# Patient Record
Sex: Female | Born: 1980 | Race: White | Hispanic: No | Marital: Married | Smoking: Current some day smoker
Health system: Southern US, Community
[De-identification: ages and names within clinical notes are randomized; demographics above are authoritative.]

---
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
## 2006-06-28 ENCOUNTER — Ambulatory Visit (HOSPITAL_COMMUNITY): Admission: RE | Admit: 2006-06-28 | Discharge: 2006-06-28 | Payer: Self-pay | Admitting: Obstetrics and Gynecology

## 2006-07-20 ENCOUNTER — Inpatient Hospital Stay (HOSPITAL_COMMUNITY): Admission: AD | Admit: 2006-07-20 | Discharge: 2006-07-22 | Payer: Self-pay | Admitting: Obstetrics and Gynecology

## 2011-02-09 ENCOUNTER — Ambulatory Visit: Payer: Self-pay | Admitting: Sports Medicine

## 2011-09-06 ENCOUNTER — Ambulatory Visit: Payer: Self-pay | Admitting: Sports Medicine

## 2014-11-25 ENCOUNTER — Encounter (HOSPITAL_COMMUNITY): Payer: Self-pay

## 2014-11-25 ENCOUNTER — Ambulatory Visit (HOSPITAL_COMMUNITY): Payer: 59 | Attending: Orthopedic Surgery

## 2014-11-25 DIAGNOSIS — R29898 Other symptoms and signs involving the musculoskeletal system: Secondary | ICD-10-CM | POA: Insufficient documentation

## 2014-11-25 DIAGNOSIS — M25511 Pain in right shoulder: Secondary | ICD-10-CM | POA: Insufficient documentation

## 2014-11-25 DIAGNOSIS — M6289 Other specified disorders of muscle: Secondary | ICD-10-CM

## 2014-11-25 DIAGNOSIS — M629 Disorder of muscle, unspecified: Secondary | ICD-10-CM

## 2014-11-25 DIAGNOSIS — M25611 Stiffness of right shoulder, not elsewhere classified: Secondary | ICD-10-CM

## 2014-11-25 DIAGNOSIS — Z9889 Other specified postprocedural states: Secondary | ICD-10-CM

## 2014-11-25 DIAGNOSIS — M7581 Other shoulder lesions, right shoulder: Secondary | ICD-10-CM | POA: Insufficient documentation

## 2014-11-25 NOTE — Therapy (Signed)
[facility] [place], [place], [address] Phone: [phone]   Fax:  [phone]  Occupational Therapy Evaluation  Patient Details  Name: Brenda Cherry MRN: [id number] Date of Birth: [birth year] Referring Provider:  Gildardo Cranker, MD  Encounter Date: 11/25/2014      OT End of Session - 11/25/14 1453    Visit Number 1   Number of Visits 20   Date for OT Re-Evaluation 01/24/15  mini reassessment:12/23/14   Authorization Type [employer] PPO (20 visit limit)   Authorization Time Period before 10th visit   Authorization - Visit Number 1   Authorization - Number of Visits 10   OT Start Time 0109   OT Stop Time 1356   OT Time Calculation (min) 49 min   Activity Tolerance Patient tolerated treatment well;Patient limited by pain   Behavior During Therapy [facility] for tasks assessed/performed      History reviewed. No pertinent past medical history.  No past surgical history on file.  There were no vitals filed for this visit.  Visit Diagnosis:  S/P arthroscopy of shoulder - Plan: Ot plan of care cert/re-cert  Pain in joint, shoulder region, right - Plan: Ot plan of care cert/re-cert  Tight fascia - Plan: Ot plan of care cert/re-cert  Weakness of shoulder - Plan: Ot plan of care cert/re-cert  Decreased range of motion of shoulder, right - Plan: Ot plan of care cert/re-cert      Subjective Assessment - 11/25/14 1427    Subjective  S: The pain has been excruiating.   Pertinent History Patient is a 34 y/o female s/p right shoulder arthroscopy on 11/10/14. Removal of calcium deposits in the supraspinatus tendon in the right shoulder performed. 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 Dr. Alphonzo Cruise has ferred patient to oocupational therapy for evaluation and treatment.    Patient Stated Goals To lift my right arm, drive,  brush hair, and play with my son.   Currently in Pain? Yes   Pain Score 8    Pain Location Shoulder   Pain Orientation Right   Pain Descriptors / Indicators Aching   Pain Type Acute pain;Surgical pain   Pain Onset 1 to 4 weeks ago   Pain Frequency Constant   Pain Relieving Factors Pain meds, ice and heat therapy.   Effect of Pain on Daily Activities Severe           [facility] OT Assessment - 11/25/14 1307    Assessment   Diagnosis right shoulder tendonitis   Onset Date --  ~approx. Feb. 2016   Prior Therapy None   Precautions   Precautions Shoulder   Type of Shoulder Precautions Progress at tolerated   Shoulder Interventions Shoulder sling/immobilizer   Precaution Comments Complete PROM until 12/08/14 then progress to [facility] and progress as tolerated   Restrictions   Weight Bearing Restrictions Yes   Balance Screen   Has the patient fallen in the past 6 months No   Has the patient had a decrease in activity level because of a fear of falling?  No   Is the patient reluctant to leave their home because of a fear of falling?  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    Mobility   Mobility Status Independent   Written Expression   Dominant Hand Right   Vision - History   Baseline Vision No visual deficits   Cognition   Overall Cognitive Status Within Functional Limits for tasks assessed   Observation/Other Assessments    Observations 2 inch healed incision on right shoulder acromion process.   ROM / Strength   AROM / PROM / Strength AROM;PROM;Strength   Palpation   Palpation comment Max fascial restrctions and tender points in right upper arm, trapezius, and scapularis region.    AROM   Overall AROM  Unable to assess;Due to pain   Overall AROM Comments --   AROM Assessment Site Shoulder   Right/Left Shoulder Right   PROM   Overall PROM Comments Assessed supine. IR/ER adducted   PROM Assessment Site Shoulder   Right/Left Shoulder Right   Right Shoulder Flexion 111 Degrees   Right Shoulder ABduction 85 Degrees   Right Shoulder Internal Rotation 90 Degrees   Right Shoulder External Rotation 30 Degrees   Strength   Overall Strength Unable to assess;Due to pain   Strength Assessment Site Shoulder   Right/Left Shoulder Right                         OT Education - 11/25/14 1453    Education Details table slides, pendulum exercises, wrist and elbow AROM   Person(s) Educated Patient   Methods Explanation;Demonstration;Handout   Comprehension Verbalized understanding;Returned demonstration          OT Short Term Goals - 11/25/14 1630    OT SHORT TERM GOAL #1   Title Patient will be educated and independent with HEP.   Time 4   Period Weeks   Status New   OT SHORT TERM GOAL #2   Title Patient will increase PROM to [facility] to increase ability to donn/doff shirts with less difficulty.    Time 4   Period Weeks   Status New   OT SHORT TERM GOAL #3   Title Patient will increase strength to 3/5 in RUE to increase ability to do light household tasks.    Time 4   Period Weeks   Status New   OT SHORT TERM GOAL #4   Title Patient will report a pain level of 5/10 or less with daily tasks.    Time 4   Period Weeks   Status New   OT SHORT TERM GOAL #5   Title Patient will decrease fascial restrctions from max to mod amount.    Time 4   Period Weeks   Status New           OT  Long Term Goals - 11/25/14 1644    OT LONG TERM GOAL #1   Title Patient will return to highest level of independence with all daily and work related tasks.    Time 8   Period Weeks   Status New   OT LONG TERM GOAL #2   Title Patient will increase AROM to  WFL to increase ability to reach into overhead cabinets and to be able to wash hair.    Time 8   Period Weeks   Status New   OT LONG TERM GOAL #3   Title Patient will increase RUE strength to 4/5 to increase ability to complete farm tasks with less difficulty.    Time 8   Period Weeks   Status New   OT LONG TERM GOAL #4   Title Patient will decrease pain to 3/10 or less with daily tasks.    Time 8   Period Weeks   Status New   OT LONG TERM GOAL #5   Title Patient will decrease fascial restrctions from mod to min amount.    Time 8   Period Weeks   Status New               Plan - 12/14/14 1618    Clinical Impression Statement A: Patient is a 34 y/o female s/p right shoulder arthroscopy causing increased pain and fascial restrctions and decreased strength and ROM resulting in difficulty completing functional tasks using right arm as dominant extremity. Spoke with Nelma Rothman at Dr. Josefa Half office to clarify any protocol or precautions we should follow. Claiborne Billings stated that patient should continue wearing the sling and she may progress as tolerated .    Pt will benefit from skilled therapeutic intervention in order to improve on the following deficits (Retired) Pain;Decreased range of motion;Increased fascial restricitons;Impaired UE functional use;Decreased strength   Rehab Potential Excellent   OT Frequency 2x / week   OT Duration 8 weeks   OT Treatment/Interventions Therapeutic exercise;Self-care/ADL training;Patient/family education;Manual Therapy;Ultrasound;Therapeutic activities;Cryotherapy;Electrical Stimulation;Moist Heat;Passive range of motion;Scar mobilization   Plan P: Skilled OT services needed to increase  functional performance during ADL tasks using right UE as dominant extremity. Treatment Plan: myofascial release, muscle energy technique, PROM, AAROM, AROM, general strengthening, modalities PRN for pain management.    Consulted and Agree with Plan of Care Patient          G-Codes - 12/14/14 1646    Functional Assessment Tool Used Clinical judgement   Functional Limitation Carrying, moving and handling objects   Carrying, Moving and Handling Objects Current Status [phone]) 100 percent impaired, limited or restricted   Carrying, Moving and Handling Objects Goal Status ([id number]) At least 20 percent but less than 40 percent impaired, limited or restricted      Problem List There are no active problems to display for this patient.   [name], OTR/L,CBIS  [phone]  12-14-14, 4:52 PM  [facility] [address] [place], [place], [address] Phone: [phone]   Fax:  [phone]

## 2014-11-25 NOTE — Patient Instructions (Signed)
  TOWEL SLIDES COMPLETE FOR 1-3 MINUTES, 3-5 TIMES PER DAY  SHOULDER: Flexion On Table   Place hands on table, elbows straight. Move hips away from body. Press hands down into table.   Abduction (Passive)   With arm out to side, resting on table, lower head toward arm, keeping trunk away from table.   Copyright  VHI. All rights reserved.     Internal Rotation (Assistive)   Seated with elbow bent at right angle and held against side, slide arm on table surface in an inward arc.  Activity: Use this motion to brush crumbs off the table.  Copyright  VHI. All rights reserved.    COMPLETE PENDULUM EXERCISES FOR 30 SECONDS TO A MINUTE EACH, 3-5 TIMES PER DAY. ROM: Pendulum (Side-to-Side)   Side to side.  http://orth.exer.us/792   Copyright  VHI. All rights reserved.  Pendulum Forward/Back   Bend forward 90 at waist, using table for support. Rock body forward and back to swing arm. Repeat ____ times. Do ____ sessions per day.  Copyright  VHI. All rights reserved.  Pendulum Circular   Bend forward 90 at waist, leaning on table for support. Rock body in a circular pattern to move arm clockwise ____ times then counterclockwise ____ times. Do ____ sessions per day.  Copyright  VHI. All rights reserved.  AROM: Wrist Extension   With right palm down, bend wrist up. Repeat 10____ times per set. Do ____ sets per session. Do __3__ sessions per day.  Copyright  VHI. All rights reserved.   AROM: Wrist Flexion   With right palm up, bend wrist up. Repeat ___10_ times per set. Do ____ sets per session. Do __3__ sessions per day.  Copyright  VHI. All rights reserved.   AROM: Forearm Pronation / Supination   With right arm in handshake position, slowly rotate palm down until stretch is felt. Relax. Then rotate palm up until stretch is felt. Repeat __10__ times per set. Do ____ sets per session. Do __3__ sessions per day.  Copyright  VHI. All rights reserved.    AFlexion (Passive)   Use other hand to bend elbow, with thumb toward same shoulder. Do NOT force this motion. Marland Kitchen

## 2014-11-26 ENCOUNTER — Ambulatory Visit (HOSPITAL_COMMUNITY): Payer: 59 | Admitting: Occupational Therapy

## 2014-11-26 ENCOUNTER — Encounter (HOSPITAL_COMMUNITY): Payer: Self-pay | Admitting: Occupational Therapy

## 2014-11-26 ENCOUNTER — Encounter (HOSPITAL_COMMUNITY): Payer: Medicare HMO

## 2014-11-26 DIAGNOSIS — M25611 Stiffness of right shoulder, not elsewhere classified: Secondary | ICD-10-CM

## 2014-11-26 DIAGNOSIS — M25511 Pain in right shoulder: Secondary | ICD-10-CM

## 2014-11-26 DIAGNOSIS — M629 Disorder of muscle, unspecified: Secondary | ICD-10-CM

## 2014-11-26 DIAGNOSIS — M6289 Other specified disorders of muscle: Secondary | ICD-10-CM

## 2014-11-26 DIAGNOSIS — R29898 Other symptoms and signs involving the musculoskeletal system: Secondary | ICD-10-CM

## 2014-11-26 NOTE — Therapy (Signed)
[facility], [place], [address] Phone: [phone]   Fax:  [phone]  Occupational Therapy Treatment  Patient Details  Name: Brenda Cherry MRN: [id number] Date of Birth: 05/29/1981 Referring Provider:  Gildardo Cranker, MD  Encounter Date: 11/26/2014      OT End of Session - 11/26/14 1518    Visit Number 2   Number of Visits 20   Date for OT Re-Evaluation 01/24/15  mini reassessment:12/23/14   Authorization Type [employer] PPO (20 visit limit)   Authorization Time Period before 10th visit   Authorization - Visit Number 2   Authorization - Number of Visits 10   OT Start Time 1352   OT Stop Time 1440   OT Time Calculation (min) 48 min   Activity Tolerance Patient tolerated treatment well;Patient limited by pain   Behavior During Therapy [facility] for tasks assessed/performed      History reviewed. No pertinent past medical history.  No past surgical history on file.  There were no vitals filed for this visit.  Visit Diagnosis:  Pain in joint, shoulder region, right  Tight fascia  Weakness of shoulder  Decreased range of motion of shoulder, right      Subjective Assessment - 11/26/14 1357    Subjective  S: I'm not as stiff or as sore as I thought I would be.    Currently in Pain? Yes   Pain Score 5    Pain Location Shoulder   Pain Orientation Right   Pain Descriptors / Indicators Aching   Pain Type Acute pain            [facility] OT Assessment - 11/26/14 1516    Assessment   Diagnosis right shoulder tendonitis   Precautions   Precautions Shoulder   Type of Shoulder Precautions Progress at tolerated                  OT Treatments/Exercises (OP) - 11/26/14 1516    Exercises   Exercises Shoulder   Shoulder Exercises: Supine   Protraction PROM;10 reps   Horizontal ABduction PROM;10 reps   External Rotation PROM;10 reps   Internal Rotation PROM;10 reps   Flexion PROM;10 reps    ABduction PROM;10 reps   Shoulder Exercises: Seated   Elevation AROM;10 reps   Row AROM;10 reps   Manual Therapy   Manual Therapy Myofascial release   Myofascial Release Myofascial release to right bicep, upper arm, trapezius, and scapularis regions to decrease pain and fascial restrictions and to increase joint mobility                OT Education - 11/25/14 1453    Education Details table slides, pendulum exercises, wrist and elbow AROM   Person(s) Educated Patient   Methods Explanation;Demonstration;Handout   Comprehension Verbalized understanding;Returned demonstration          OT Short Term Goals - 11/26/14 1520    OT SHORT TERM GOAL #1   Title Patient will be educated and independent with HEP.   Time 4   Period Weeks   Status On-going   OT SHORT TERM GOAL #2   Title Patient will increase PROM to [facility] to increase ability to donn/doff shirts with less difficulty.    Time 4   Period Weeks   Status On-going   OT SHORT TERM GOAL #3   Title Patient will increase strength to 3/5 in RUE to increase ability to do light household tasks.  Time 4   Period Weeks   Status On-going   OT SHORT TERM GOAL #4   Title Patient will report a pain level of 5/10 or less with daily tasks.    Time 4   Period Weeks   Status On-going   OT SHORT TERM GOAL #5   Title Patient will decrease fascial restrctions from max to mod amount.    Time 4   Period Weeks   Status On-going           OT Long Term Goals - 11/26/14 1520    OT LONG TERM GOAL #1   Title Patient will return to highest level of independence with all daily and work related tasks.    Time 8   Period Weeks   Status On-going   OT LONG TERM GOAL #2   Title Patient will increase AROM to [facility] to increase ability to reach into overhead cabinets and to be able to wash hair.    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    Time 8   Period Weeks   Status On-going   OT LONG TERM GOAL #4   Title Patient will decrease pain to 3/10 or less with daily tasks.    Time 8   Period Weeks   Status On-going   OT LONG TERM GOAL #5   Title Patient will decrease fascial restrctions from mod to min amount.    Time 8   Period Weeks   Status On-going               Plan - 11/26/14 1518    Clinical Impression Statement A: Initiated myofascial release and manual therapy, PROM, AROM exercises this date. Pt reports decreased pain from evaluation yesterday. Pt reports she has been icing her arm and trying to complete her HEP. Pt tolerated treatment well. Provided pt with copy of evaluation and reviewed goals.    Plan P: Continue increasing PROM, increase AROM and therapy ball to 15 repetitions.           G-Codes - 11-Dec-2014 1646    Functional Assessment Tool Used Clinical judgement   Functional Limitation Carrying, moving and handling objects   Carrying, Moving and Handling Objects Current Status [phone]) 100 percent impaired, limited or restricted   Carrying, Moving and Handling Objects Goal Status ([id number]) At least 20 percent but less than 40 percent impaired, limited or restricted      Problem List There are no active problems to display for this patient.   Guadelupe Sabin, OTR/L  [phone]  11/26/2014, 3:22 PM  [facility] [address] [place], [place], [address] Phone: [phone]   Fax:  [phone]

## 2014-11-30 ENCOUNTER — Ambulatory Visit (HOSPITAL_COMMUNITY): Payer: 59

## 2014-11-30 DIAGNOSIS — M629 Disorder of muscle, unspecified: Secondary | ICD-10-CM

## 2014-11-30 DIAGNOSIS — M25611 Stiffness of right shoulder, not elsewhere classified: Secondary | ICD-10-CM

## 2014-11-30 DIAGNOSIS — M25511 Pain in right shoulder: Secondary | ICD-10-CM

## 2014-11-30 DIAGNOSIS — M6289 Other specified disorders of muscle: Secondary | ICD-10-CM

## 2014-11-30 DIAGNOSIS — R29898 Other symptoms and signs involving the musculoskeletal system: Secondary | ICD-10-CM

## 2014-11-30 NOTE — Therapy (Signed)
[facility], [place], [address] Phone: [phone]   Fax:  [phone]  Occupational Therapy Treatment  Patient Details  Name: Brenda Cherry MRN: [id number] Date of Birth: February 22, 1981 Referring Provider:  Gildardo Cranker, MD  Encounter Date: 11/30/2014      OT End of Session - 11/30/14 1348    Visit Number 3   Number of Visits 20   Date for OT Re-Evaluation 01/24/15  mini reassessment:12/23/14   Authorization Type [employer] PPO (20 visit limit)   Authorization Time Period before 10th visit   Authorization - Visit Number 3   Authorization - Number of Visits 10   OT Start Time 1309   OT Stop Time 1350   OT Time Calculation (min) 41 min   Activity Tolerance Patient tolerated treatment well;Patient limited by pain   Behavior During Therapy [facility] for tasks assessed/performed      No past medical history on file.  No past surgical history on file.  There were no vitals filed for this visit.  Visit Diagnosis:  Pain in joint, shoulder region, right  Tight fascia  Weakness of shoulder  Stiffness of joint, shoulder region, right                    OT Treatments/Exercises (OP) - 11/30/14 1337    Exercises   Exercises Shoulder   Shoulder Exercises: Supine   Protraction PROM;10 reps   Horizontal ABduction PROM;10 reps   External Rotation PROM;10 reps   Internal Rotation PROM;10 reps   Flexion PROM;10 reps   ABduction PROM;10 reps   Other Supine Exercises Bridges 12X   Shoulder Exercises: Therapy Ball   Flexion 15 reps   ABduction 15 reps   Shoulder Exercises: Isometric Strengthening   Flexion Supine;3X3"   Extension Supine;3X3"   External Rotation Supine;3X3"   Internal Rotation Supine;3X3"   ABduction Supine;3X3"   ADduction Supine;3X3"   Manual Therapy   Manual Therapy Myofascial release   Myofascial Release Myofascial release to right bicep, upper arm, trapezius, and  scapularis regions to decrease pain and fascial restrictions and to increase joint mobility                  OT Short Term Goals - 11/26/14 1520    OT SHORT TERM GOAL #1   Title Patient will be educated and independent with HEP.   Time 4   Period Weeks   Status On-going   OT SHORT TERM GOAL #2   Title Patient will increase PROM to [facility] to increase ability to donn/doff shirts with less difficulty.    Time 4   Period Weeks   Status On-going   OT SHORT TERM GOAL #3   Title Patient will increase strength to 3/5 in RUE to increase ability to do light household tasks.    Time 4   Period Weeks   Status On-going   OT SHORT TERM GOAL #4   Title Patient will report a pain level of 5/10 or less with daily tasks.    Time 4   Period Weeks   Status On-going   OT SHORT TERM GOAL #5   Title Patient will decrease fascial restrctions from max to mod amount.    Time 4   Period Weeks   Status On-going           OT Long Term Goals - 11/26/14 1520    OT LONG TERM GOAL #1   Title Patient  will return to highest level of independence with all daily and work related tasks.    Time 8   Period Weeks   Status On-going   OT LONG TERM GOAL #2   Title Patient will increase AROM to [facility] to increase ability to reach into overhead cabinets and to be able to wash hair.    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    Time 8   Period Weeks   Status On-going   OT LONG TERM GOAL #4   Title Patient will decrease pain to 3/10 or less with daily tasks.    Time 8   Period Weeks   Status On-going   OT LONG TERM GOAL #5   Title Patient will decrease fascial restrctions from mod to min amount.    Time 8   Period Weeks   Status On-going               Plan - 11/30/14 1348    Clinical Impression Statement A: Increased therapy ball exercises and added  isometric exercises and bridges supine. Patient tolerated well. Continues to have increased tenderness distal to incision at medial deltoid.    Plan P: Increase isometrics to 3x5. Cont to work on increasing PROM needed during functional tasks.         Problem List There are no active problems to display for this patient.   [name], OTR/L,CBIS  [phone]  11/30/2014, 2:34 PM  [facility] [address] [place], [place], [address] Phone: [phone]   Fax:  [phone]

## 2014-12-03 ENCOUNTER — Encounter (HOSPITAL_COMMUNITY): Payer: Self-pay

## 2014-12-03 ENCOUNTER — Ambulatory Visit (HOSPITAL_COMMUNITY): Payer: 59

## 2014-12-03 DIAGNOSIS — M6289 Other specified disorders of muscle: Secondary | ICD-10-CM

## 2014-12-03 DIAGNOSIS — M25611 Stiffness of right shoulder, not elsewhere classified: Secondary | ICD-10-CM

## 2014-12-03 DIAGNOSIS — R29898 Other symptoms and signs involving the musculoskeletal system: Secondary | ICD-10-CM

## 2014-12-03 DIAGNOSIS — M25511 Pain in right shoulder: Secondary | ICD-10-CM | POA: Diagnosis not present

## 2014-12-03 DIAGNOSIS — M629 Disorder of muscle, unspecified: Secondary | ICD-10-CM

## 2014-12-03 NOTE — Therapy (Signed)
[facility] [address] [place], [place], [address] Phone: [phone]   Fax:  [phone]  Occupational Therapy Treatment  Patient Details  Name: Brenda Cherry MRN: [id number] Date of Birth: 1981-04-19 Referring Provider:  Gildardo Cranker, MD  Encounter Date: 12/03/2014      OT End of Session - 12/03/14 1508    Visit Number 4   Number of Visits 20   Date for OT Re-Evaluation 01/24/15  mini reassessment:12/23/14   Authorization Type [employer] P[employer] (20 visit limit)   Authorization Time Period before 10th visit   Authorization - Visit Number 4   Authorization - Number of Visits 10   OT Start Time 4270   OT Stop Time 1430   OT Time Calculation (min) 35 min   Activity Tolerance Patient tolerated treatment well;Patient limited by pain   Behavior During Therapy [facility] for tasks assessed/performed      History reviewed. No pertinent past medical history.  No past surgical history on file.  There were no vitals filed for this visit.  Visit Diagnosis:  Tight fascia  Pain in joint, shoulder region, right  Weakness of shoulder  Stiffness of joint, shoulder region, right      Subjective Assessment - 12/03/14 1507    Subjective  S: I have been working really hard at home on this shoulder.    Currently in Pain? Yes   Pain Score 3    Pain Location Shoulder   Pain Orientation Right   Pain Descriptors / Indicators Aching   Pain Type Acute pain            [facility] OT Assessment - 12/03/14 1421    Assessment   Diagnosis right shoulder tendonitis   Precautions   Precautions Shoulder   Type of Shoulder Precautions Progress at tolerated                  OT Treatments/Exercises (OP) - 12/03/14 1421    Exercises   Exercises Shoulder   Shoulder Exercises: Supine   Protraction PROM;10 reps   Horizontal ABduction PROM;10 reps   External Rotation PROM;10 reps   Internal Rotation PROM;10 reps   Flexion PROM;10  reps   ABduction PROM;10 reps   Other Supine Exercises bridges 15X   Manual Therapy   Manual Therapy Myofascial release   Myofascial Release Myofascial release to right bicep, upper arm, trapezius, and scapularis regions to decrease pain and fascial restrictions and to increase joint mobility                  OT Short Term Goals - 11/26/14 1520    OT SHORT TERM GOAL #1   Title Patient will be educated and independent with HEP.   Time 4   Period Weeks   Status On-going   OT SHORT TERM GOAL #2   Title Patient will increase PROM to [facility] to increase ability to donn/doff shirts with less difficulty.    Time 4   Period Weeks   Status On-going   OT SHORT TERM GOAL #3   Title Patient will increase strength to 3/5 in RUE to increase ability to do light household tasks.    Time 4   Period Weeks   Status On-going   OT SHORT TERM GOAL #4   Title Patient will report a pain level of 5/10 or less with daily tasks.    Time 4   Period Weeks   Status On-going   OT SHORT TERM  GOAL #5   Title Patient will decrease fascial restrctions from max to mod amount.    Time 4   Period Weeks   Status On-going           OT Long Term Goals - 11/26/14 1520    OT LONG TERM GOAL #1   Title Patient will return to highest level of independence with all daily and work related tasks.    Time 8   Period Weeks   Status On-going   OT LONG TERM GOAL #2   Title Patient will increase AROM to [facility] to increase ability to reach into overhead cabinets and to be able to wash hair.    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    Time 8   Period Weeks   Status On-going   OT LONG TERM GOAL #4   Title Patient will decrease pain to 3/10 or less with daily tasks.    Time 8   Period Weeks   Status On-going   OT LONG TERM GOAL #5   Title Patient will decrease fascial  restrctions from mod to min amount.    Time 8   Period Weeks   Status On-going               Plan - 12/03/14 1509    Clinical Impression Statement A: Increased isometrics to 3x5. Patient was able to achieve full range of motion with shoulder flexion.   Plan P: Add anterior and caudel glides.         Problem List There are no active problems to display for this patient.   [name], OTR/L,CBIS  [phone]  12/03/2014, 3:44 PM  [place] [address] [place], [place], [address] Phone: [phone]   Fax:  [phone]

## 2014-12-07 ENCOUNTER — Ambulatory Visit (HOSPITAL_COMMUNITY): Payer: 59 | Admitting: Occupational Therapy

## 2014-12-10 ENCOUNTER — Encounter (HOSPITAL_COMMUNITY): Payer: Self-pay

## 2014-12-10 ENCOUNTER — Ambulatory Visit (HOSPITAL_COMMUNITY): Payer: 59

## 2014-12-10 DIAGNOSIS — M25611 Stiffness of right shoulder, not elsewhere classified: Secondary | ICD-10-CM

## 2014-12-10 DIAGNOSIS — R29898 Other symptoms and signs involving the musculoskeletal system: Secondary | ICD-10-CM

## 2014-12-10 DIAGNOSIS — M629 Disorder of muscle, unspecified: Secondary | ICD-10-CM

## 2014-12-10 DIAGNOSIS — M6289 Other specified disorders of muscle: Secondary | ICD-10-CM

## 2014-12-10 DIAGNOSIS — M25511 Pain in right shoulder: Secondary | ICD-10-CM | POA: Diagnosis not present

## 2014-12-10 NOTE — Therapy (Signed)
[facility] [address] [place], [place], [address] Phone: [phone]   Fax:  [phone]  Occupational Therapy Treatment  Patient Details  Name: Brenda Cherry MRN: [id number] Date of Birth: [birth year] Referring Provider:  Gildardo Cranker, MD  Encounter Date: 12/10/2014      OT End of Session - 12/10/14 1508    Visit Number 5   Number of Visits 20   Date for OT Re-Evaluation 01/24/15  mini reassessment:12/23/14   Authorization Type [employer] P[employer] (20 visit limit)   Authorization Time Period before 10th visit   Authorization - Visit Number 5   Authorization - Number of Visits 10   OT Start Time 9150   OT Stop Time 1348   OT Time Calculation (min) 35 min   Activity Tolerance Patient tolerated treatment well;Patient limited by pain   Behavior During Therapy [facility] for tasks assessed/performed      History reviewed. No pertinent past medical history.  No past surgical history on file.  There were no vitals filed for this visit.  Visit Diagnosis:  Tight fascia  Pain in joint, shoulder region, right  Weakness of shoulder  Stiffness of joint, shoulder region, right      Subjective Assessment - 12/10/14 1341    Subjective  S: I have had a really hard time getting comfortable at night to sleep.    Currently in Pain? Yes   Pain Score 3    Pain Location Shoulder   Pain Orientation Right   Pain Descriptors / Indicators Aching   Pain Type Acute pain            [facility] OT Assessment - 12/10/14 1344    Assessment   Diagnosis right shoulder tendonitis   Precautions   Precautions Shoulder   Type of Shoulder Precautions Progress at tolerated                  OT Treatments/Exercises (OP) - 12/10/14 1344    Exercises   Exercises Shoulder   Shoulder Exercises: Supine   Protraction PROM;10 reps   Horizontal ABduction PROM;10 reps   External Rotation PROM;10 reps   Internal Rotation PROM;10 reps   Flexion PROM;10 reps   ABduction PROM;10 reps   Other Supine Exercises bridges 15X   Shoulder Exercises: ROM/Strengthening   Anterior Glide 3X10"   Caudal Glide 3X10"   Manual Therapy   Manual Therapy Myofascial release   Myofascial Release Myofascial release to right bicep, upper arm, trapezius, and scapularis regions to decrease pain and fascial restrictions and to increase joint mobility                  OT Short Term Goals - 11/26/14 1520    OT SHORT TERM GOAL #1   Title Patient will be educated and independent with HEP.   Time 4   Period Weeks   Status On-going   OT SHORT TERM GOAL #2   Title Patient will increase PROM to [facility] to increase ability to donn/doff shirts with less difficulty.    Time 4   Period Weeks   Status On-going   OT SHORT TERM GOAL #3   Title Patient will increase strength to 3/5 in RUE to increase ability to do light household tasks.    Time 4   Period Weeks   Status On-going   OT SHORT TERM GOAL #4   Title Patient will report a pain level of 5/10 or less with daily tasks.  Time 4   Period Weeks   Status On-going   OT SHORT TERM GOAL #5   Title Patient will decrease fascial restrctions from max to mod amount.    Time 4   Period Weeks   Status On-going           OT Long Term Goals - 11/26/14 1520    OT LONG TERM GOAL #1   Title Patient will return to highest level of independence with all daily and work related tasks.    Time 8   Period Weeks   Status On-going   OT LONG TERM GOAL #2   Title Patient will increase AROM to [facility] to increase ability to reach into overhead cabinets and to be able to wash hair.    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    Time 8   Period Weeks   Status On-going   OT LONG TERM GOAL #4   Title Patient will decrease pain to 3/10 or less with daily tasks.    Time  8   Period Weeks   Status On-going   OT LONG TERM GOAL #5   Title Patient will decrease fascial restrctions from mod to min amount.    Time 8   Period Weeks   Status On-going               Plan - 12/10/14 1509    Clinical Impression Statement A: Pt reports that she is now able to wash her hair and wipe herself in the bathroom. Pt is making great progress towards therapy goals. patient recently was seen by surgeon although he did not give anything additional for pain or muscle spasms. Pt reports that she will be seeing her primary MD now for her shoulder. Added anterior and caudel glides. Pt tolerated well.    Plan P: Attempt AAROM.        Problem List There are no active problems to display for this patient.   [name], OTR/L,CBIS  [phone]  12/10/2014, 3:21 PM  [place] [address] [place], [place], [address] Phone: [phone]   Fax:  [phone]

## 2014-12-14 ENCOUNTER — Ambulatory Visit (HOSPITAL_COMMUNITY): Payer: 59

## 2014-12-14 ENCOUNTER — Encounter (HOSPITAL_COMMUNITY): Payer: Self-pay

## 2014-12-14 DIAGNOSIS — M6289 Other specified disorders of muscle: Secondary | ICD-10-CM

## 2014-12-14 DIAGNOSIS — M629 Disorder of muscle, unspecified: Secondary | ICD-10-CM

## 2014-12-14 DIAGNOSIS — M25511 Pain in right shoulder: Secondary | ICD-10-CM

## 2014-12-14 DIAGNOSIS — R29898 Other symptoms and signs involving the musculoskeletal system: Secondary | ICD-10-CM

## 2014-12-14 DIAGNOSIS — M25611 Stiffness of right shoulder, not elsewhere classified: Secondary | ICD-10-CM

## 2014-12-14 NOTE — Therapy (Signed)
[facility] [address] [place], [place], [address] Phone: [phone]   Fax:  [phone]  Occupational Therapy Treatment  Patient Details  Name: Brenda Cherry MRN: [id number] Date of Birth: 1980-11-29 Referring Provider:  Gildardo Cranker, MD  Encounter Date: 12/14/2014      OT End of Session - 12/14/14 1430    Visit Number 6   Number of Visits 20   Date for OT Re-Evaluation 01/24/15  mini reassessment:12/23/14   Authorization Type [employer] PPO (20 visit limit)   Authorization Time Period before 10th visit   Authorization - Visit Number 6   Authorization - Number of Visits 10   OT Start Time 1310   OT Stop Time 1350   OT Time Calculation (min) 40 min   Activity Tolerance Patient tolerated treatment well   Behavior During Therapy [facility] for tasks assessed/performed      History reviewed. No pertinent past medical history.  No past surgical history on file.  There were no vitals filed for this visit.  Visit Diagnosis:  Tight fascia  Pain in joint, shoulder region, right  Weakness of shoulder  Stiffness of joint, shoulder region, right      Subjective Assessment - 12/14/14 1327    Subjective  S: I have been really working on my exercises this weekend.    Currently in Pain? Yes   Pain Score 1    Pain Location Shoulder   Pain Orientation Right   Pain Descriptors / Indicators Dull;Aching   Pain Type Acute pain            [facility] OT Assessment - 12/14/14 1328    Assessment   Diagnosis right shoulder tendonitis   Precautions   Precautions Shoulder   Type of Shoulder Precautions Progress at tolerated                  OT Treatments/Exercises (OP) - 12/14/14 1328    Exercises   Exercises Shoulder   Shoulder Exercises: Supine   Protraction PROM;5 reps;AAROM;12 reps   Horizontal ABduction PROM;5 reps;AAROM;12 reps   External Rotation PROM;5 reps;AAROM;12 reps   Internal Rotation PROM;5  reps;AAROM;12 reps   Flexion PROM;5 reps;AAROM;12 reps   ABduction PROM;5 reps;AAROM;12 reps   Shoulder Exercises: Standing   Protraction AAROM;12 reps   Horizontal ABduction AAROM;10 reps   Flexion AAROM;10 reps   ABduction AAROM;10 reps   Manual Therapy   Manual Therapy Myofascial release   Myofascial Release Myofascial release to right bicep, upper arm, trapezius, and scapularis regions to decrease pain and fascial restrictions and to increase joint mobility                OT Education - 12/14/14 1334    Education provided Yes   Education Details AAROM exercises   Person(s) Educated Patient   Methods Explanation;Demonstration;Handout   Comprehension Verbalized understanding;Returned demonstration          OT Short Term Goals - 11/26/14 1520    OT SHORT TERM GOAL #1   Title Patient will be educated and independent with HEP.   Time 4   Period Weeks   Status On-going   OT SHORT TERM GOAL #2   Title Patient will increase PROM to [facility] to increase ability to donn/doff shirts with less difficulty.    Time 4   Period Weeks   Status On-going   OT SHORT TERM GOAL #3   Title Patient will increase strength to 3/5 in RUE to increase  ability to do light household tasks.    Time 4   Period Weeks   Status On-going   OT SHORT TERM GOAL #4   Title Patient will report a pain level of 5/10 or less with daily tasks.    Time 4   Period Weeks   Status On-going   OT SHORT TERM GOAL #5   Title Patient will decrease fascial restrctions from max to mod amount.    Time 4   Period Weeks   Status On-going           OT Long Term Goals - 11/26/14 1520    OT LONG TERM GOAL #1   Title Patient will return to highest level of independence with all daily and work related tasks.    Time 8   Period Weeks   Status On-going   OT LONG TERM GOAL #2   Title Patient will increase AROM to [facility] to increase ability to reach into overhead cabinets and to be able to wash hair.    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    Time 8   Period Weeks   Status On-going   OT LONG TERM GOAL #4   Title Patient will decrease pain to 3/10 or less with daily tasks.    Time 8   Period Weeks   Status On-going   OT LONG TERM GOAL #5   Title Patient will decrease fascial restrctions from mod to min amount.    Time 8   Period Weeks   Status On-going               Plan - 12/14/14 1431    Clinical Impression Statement A: Progressed to AAROM supine and standing, added proximal shoulder strengthening supine. Patient tolerated well. Some pain noted with standing AAROM. Pt given updated HEP.    Plan P: Complete missed standing AAROM exercises. Add wall wash and pulleys.        Problem List There are no active problems to display for this patient.   [name], OTR/L,CBIS  [phone]  12/14/2014, 2:33 PM  [facility] [address] [place], [place], [address] Phone: [phone]   Fax:  [phone]

## 2014-12-14 NOTE — Patient Instructions (Signed)
Perform each exercise ___12_____ reps. 2-3x days.   WAND PRESS - STANDING  Start by holding a wand or cane at chest height.  Next, slowly push the wand outwards in front of your body so that your elbows become fully straightened. Then, return to the original position.     WAND FLEXION - STANDING - PALMS UP  In the standing position, hold a wand/cane with both arms, palms up on both sides. Raise up the wand/cane allowing your unaffected arm to perform most of the effort. Your affected arm should be partially relaxed.      WAND ROTATION - STANDING  In the standing position, hold a wand/cane with both hands keeping your elbows bent. Move your arms and wand/cane to one side.  Your affected arm should be partially relaxed while your unaffected arm performs most of the effort.       WAND ABDUCTION - STANDING  While holding a wand/cane palm face up on the injured side and palm face down on the uninjured side, slowly raise up your injured arm to the side.   Horizontal Abduction/Adduction    Cane Horizontal - Standing   Straight arms holding cane at shoulder height, bring cane to right, center, left. Repeat starting to left.

## 2014-12-17 ENCOUNTER — Ambulatory Visit (HOSPITAL_COMMUNITY): Payer: 59

## 2014-12-17 DIAGNOSIS — M25611 Stiffness of right shoulder, not elsewhere classified: Secondary | ICD-10-CM

## 2014-12-17 DIAGNOSIS — M25511 Pain in right shoulder: Secondary | ICD-10-CM | POA: Diagnosis not present

## 2014-12-17 DIAGNOSIS — M629 Disorder of muscle, unspecified: Secondary | ICD-10-CM

## 2014-12-17 DIAGNOSIS — R29898 Other symptoms and signs involving the musculoskeletal system: Secondary | ICD-10-CM

## 2014-12-17 DIAGNOSIS — M6289 Other specified disorders of muscle: Secondary | ICD-10-CM

## 2014-12-17 NOTE — Therapy (Signed)
[facility] [address] [place], [place], [address] Phone: [phone]   Fax:  [phone]  Occupational Therapy Treatment  Patient Details  Name: Brenda Cherry MRN: [id number] Date of Birth: 1980/09/07 Referring Provider:  Gildardo Cranker, MD  Encounter Date: 12/17/2014      OT End of Session - 12/17/14 1456    Visit Number 7   Number of Visits 20   Date for OT Re-Evaluation 01/24/15  mini reassessment:12/23/14   Authorization Type [employer] PPO (20 visit limit)   Authorization Time Period before 10th visit   Authorization - Visit Number 7   Authorization - Number of Visits 10   OT Start Time 1305   OT Stop Time 1345   OT Time Calculation (min) 40 min   Activity Tolerance Patient tolerated treatment well   Behavior During Therapy [facility] for tasks assessed/performed      No past medical history on file.  No past surgical history on file.  There were no vitals filed for this visit.  Visit Diagnosis:  Tight fascia  Pain in joint, shoulder region, right  Weakness of shoulder  Stiffness of joint, shoulder region, right      Subjective Assessment - 12/17/14 1327    Subjective  S: My shoulder has just been angry since the last time.    Currently in Pain? Yes   Pain Score 6    Pain Location Shoulder   Pain Orientation Right   Pain Descriptors / Indicators Dull;Aching   Pain Type Acute pain            [facility] OT Assessment - 12/17/14 1327    Assessment   Diagnosis right shoulder tendonitis   Precautions   Precautions Shoulder   Type of Shoulder Precautions Progress at tolerated                  OT Treatments/Exercises (OP) - 12/17/14 1327    Exercises   Exercises Shoulder   Shoulder Exercises: Supine   Protraction PROM;5 reps;AAROM;10 reps   Horizontal ABduction PROM;5 reps;AAROM;10 reps   External Rotation PROM;5 reps;AAROM;10 reps   Internal Rotation PROM;5 reps;AAROM;10 reps   Flexion  PROM;5 reps;AAROM;10 reps   ABduction PROM;5 reps;AAROM;10 reps   Shoulder Exercises: Standing   Protraction AAROM;10 reps   Horizontal ABduction AAROM;10 reps   External Rotation AAROM;10 reps   Internal Rotation AAROM;10 reps   Flexion AAROM;10 reps   ABduction AAROM;10 reps   Shoulder Exercises: Pulleys   Flexion 1 minute   ABduction 1 minute   Shoulder Exercises: ROM/Strengthening   Wall Wash 1'   Proximal Shoulder Strengthening, Supine 10X no rest breaks   Manual Therapy   Manual Therapy Myofascial release   Myofascial Release Myofascial release to right bicep, upper arm, trapezius, and scapularis regions to decrease pain and fascial restrictions and to increase joint mobility                  OT Short Term Goals - 11/26/14 1520    OT SHORT TERM GOAL #1   Title Patient will be educated and independent with HEP.   Time 4   Period Weeks   Status On-going   OT SHORT TERM GOAL #2   Title Patient will increase PROM to [facility] to increase ability to donn/doff shirts with less difficulty.    Time 4   Period Weeks   Status On-going   OT SHORT TERM GOAL #3   Title Patient will increase  strength to 3/5 in RUE to increase ability to do light household tasks.    Time 4   Period Weeks   Status On-going   OT SHORT TERM GOAL #4   Title Patient will report a pain level of 5/10 or less with daily tasks.    Time 4   Period Weeks   Status On-going   OT SHORT TERM GOAL #5   Title Patient will decrease fascial restrctions from max to mod amount.    Time 4   Period Weeks   Status On-going           OT Long Term Goals - 11/26/14 1520    OT LONG TERM GOAL #1   Title Patient will return to highest level of independence with all daily and work related tasks.    Time 8   Period Weeks   Status On-going   OT LONG TERM GOAL #2   Title Patient will increase AROM to [facility] to increase ability to reach into overhead cabinets and to be able to wash hair.    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    Time 8   Period Weeks   Status On-going   OT LONG TERM GOAL #4   Title Patient will decrease pain to 3/10 or less with daily tasks.    Time 8   Period Weeks   Status On-going   OT LONG TERM GOAL #5   Title Patient will decrease fascial restrctions from mod to min amount.    Time 8   Period Weeks   Status On-going               Plan - 12/17/14 1456    Clinical Impression Statement A: Added pulleys and wall wash this date. patient tolerated well. Decreased reps due to patient complaining of increased pain from last session. Pt continues to have difficulty becoming comfortable at night.    Plan P: Increase reps back to 12X if able.        Problem List There are no active problems to display for this patient.   [name], OTR/L,CBIS  [phone]  12/17/2014, 2:58 PM  [place] [address] [place], [place], [address] Phone: [phone]   Fax:  [phone]

## 2014-12-21 ENCOUNTER — Ambulatory Visit (HOSPITAL_COMMUNITY): Payer: 59

## 2014-12-22 ENCOUNTER — Encounter (HOSPITAL_COMMUNITY): Payer: Medicare HMO

## 2014-12-24 ENCOUNTER — Encounter (HOSPITAL_COMMUNITY): Payer: Medicare HMO

## 2014-12-31 ENCOUNTER — Telehealth (HOSPITAL_COMMUNITY): Payer: Self-pay

## 2014-12-31 NOTE — Telephone Encounter (Signed)
7/7 left message to see if patient wanted to schedule more appointments.

## 2015-01-22 ENCOUNTER — Encounter (HOSPITAL_COMMUNITY): Payer: Self-pay

## 2015-01-22 NOTE — Therapy (Signed)
[facility] [address] [place], [place], [address] Phone: [phone]   Fax:  [phone]  Patient Details  Name: Brenda Cherry MRN: [id number] Date of Birth: 1980/08/24 Referring Provider:  No ref. provider found  Encounter Date: 01/22/2015 .OCCUPATIONAL THERAPY DISCHARGE SUMMARY  Visits from Start of Care: 7  Current functional level related to goals / functional outcomes: OT LONG TERM GOAL #1    Title Patient will return to highest level of independence with all daily and work related tasks.    Time 8   Period Weeks   Status On-going   OT LONG TERM GOAL #2   Title Patient will increase AROM to [facility] to increase ability to reach into overhead cabinets and to be able to wash hair.    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    Time 8   Period Weeks   Status On-going   OT LONG TERM GOAL #4   Title Patient will decrease pain to 3/10 or less with daily tasks.    Time 8   Period Weeks   Status On-going   OT LONG TERM GOAL #5   Title Patient will decrease fascial restrctions from mod to min amount.    Time 8   Period Weeks   Status On-going          Remaining deficits: Patient's last therapy appt completed was 12/17/14. Pt cancelled remaining appts due to scheduling conflict. Therapy clinic made an attempt to call and schedule more appts. Patient never returned call. Pt will be discharged to failure to return to clinic. If patient wishes to continue therapy she will need a new order signed by MD.    Education / Equipment: Table slides Plan:                                                    Patient goals were not met. Patient is being discharged due to not returning since the last visit.  ?????       [name],  OTR/L,CBIS  [phone]  01/22/2015, 10:45 AM  [place], [place], [address] Phone: [phone]   Fax:  [phone]

## 2015-10-13 ENCOUNTER — Other Ambulatory Visit (HOSPITAL_COMMUNITY): Payer: Self-pay | Admitting: Orthopedic Surgery

## 2015-10-13 DIAGNOSIS — M542 Cervicalgia: Secondary | ICD-10-CM

## 2015-10-13 DIAGNOSIS — M25511 Pain in right shoulder: Secondary | ICD-10-CM

## 2015-10-21 ENCOUNTER — Ambulatory Visit (HOSPITAL_COMMUNITY)
Admission: RE | Admit: 2015-10-21 | Discharge: 2015-10-21 | Disposition: A | Discharge status: Home / Self Care | Payer: BLUE CROSS/BLUE SHIELD | Source: Ambulatory Visit | Attending: Orthopedic Surgery | Admitting: Orthopedic Surgery

## 2015-10-21 DIAGNOSIS — M47812 Spondylosis without myelopathy or radiculopathy, cervical region: Secondary | ICD-10-CM | POA: Diagnosis not present

## 2015-10-21 DIAGNOSIS — M25511 Pain in right shoulder: Secondary | ICD-10-CM | POA: Insufficient documentation

## 2015-10-21 DIAGNOSIS — M542 Cervicalgia: Secondary | ICD-10-CM | POA: Insufficient documentation

## 2017-05-21 IMAGING — MR MR SHOULDER*R* W/O CM
4 of 5 series · 19 of 40 positions shown · non-contrast
Comparison: MRI right shoulder 08/28/2014.

CLINICAL DATA: Right shoulder pain for 3 years, worsened over the
past year. No known injury. History of surgery approximately 1 year
ago. Subsequent encounter.

EXAM:
MRI OF THE RIGHT SHOULDER WITHOUT CONTRAST
TECHNIQUE: Multiplanar, multisequence MR imaging of the shoulder was performed.
No intravenous contrast was administered.

[Series 3: t2fs axial · axial · 3.0mm · 0.21mm/px · z∈[-4,+46]mm · 3 of 20 slices shown]
[im 3/20]
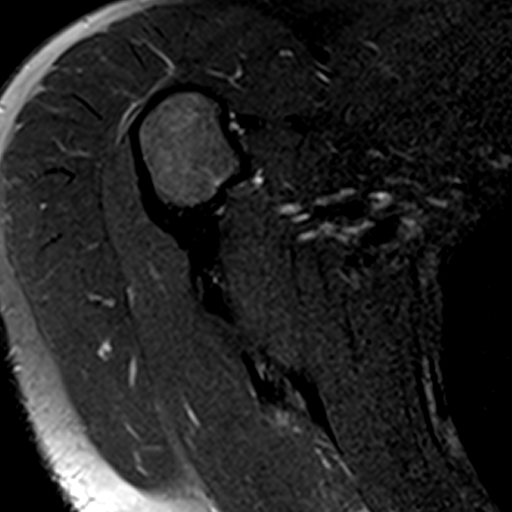
[im 11/20]
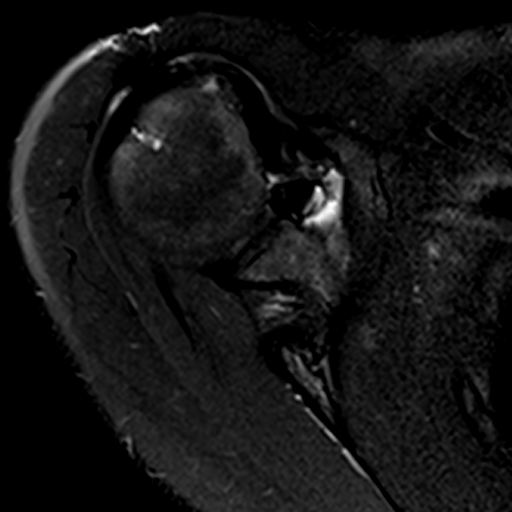
[im 17/20]
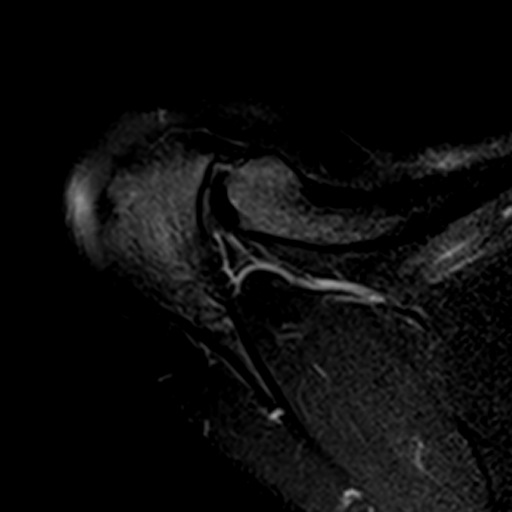

[Series 4: t2fs coronal · sagittal · 3.0mm · 0.21mm/px · 3 of 20 slices shown]
[im 4/20]
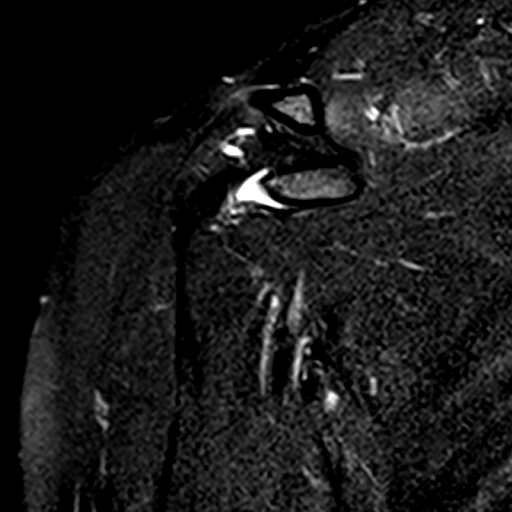
[im 10/20]
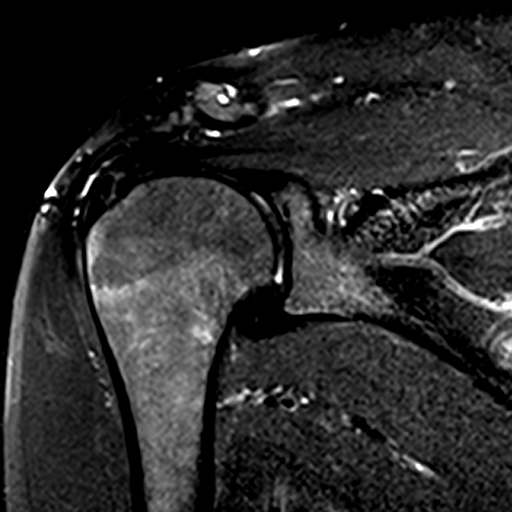
[im 16/20]
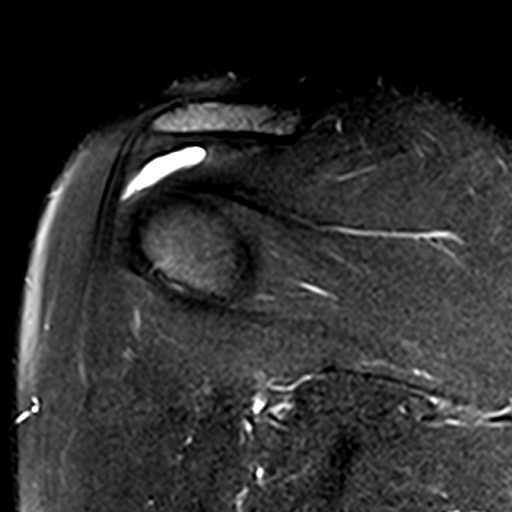

[Series 5: PD · sagittal · 3.0mm · 0.21mm/px · 7 of 20 slices shown]
[im 1/20]
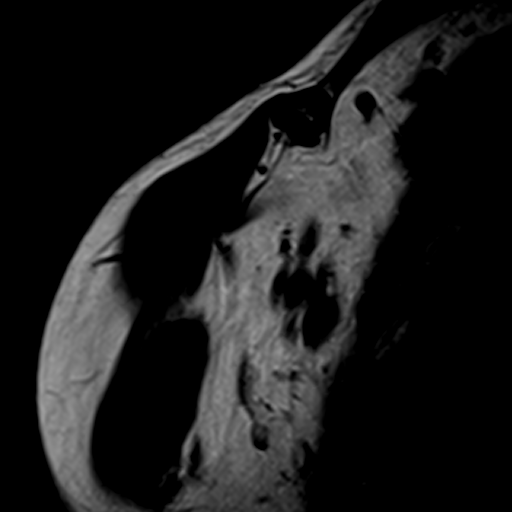
[im 4/20]
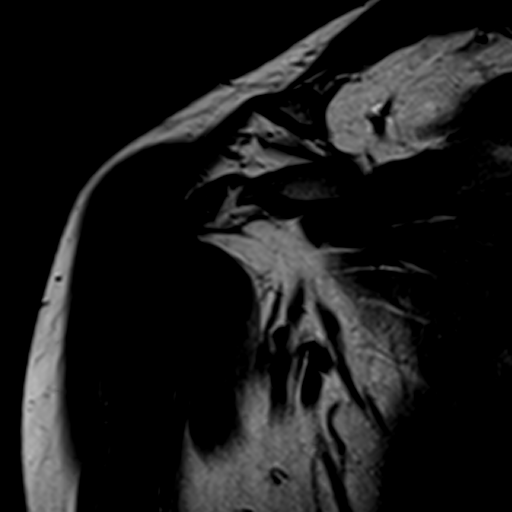
[im 7/20]
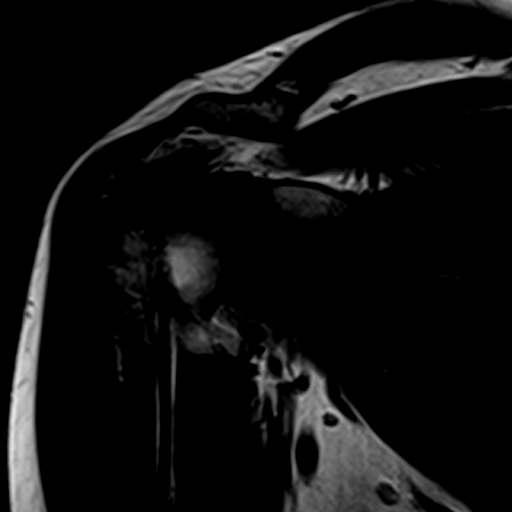
[im 10/20]
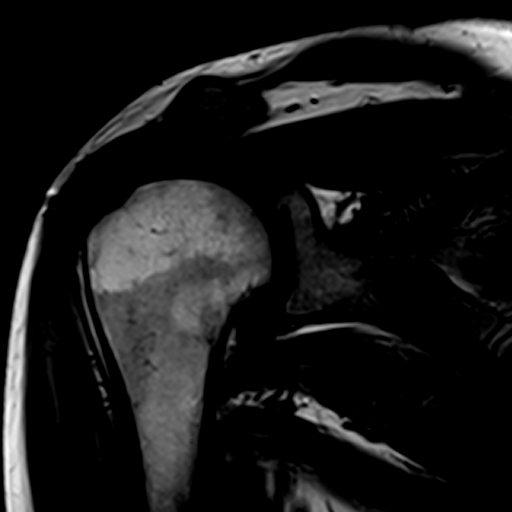
[im 13/20]
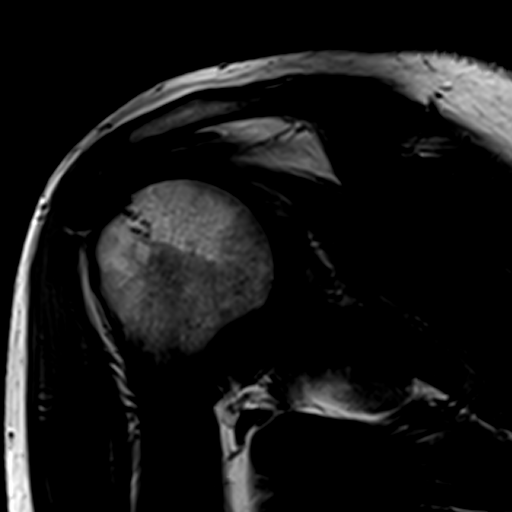
[im 16/20]
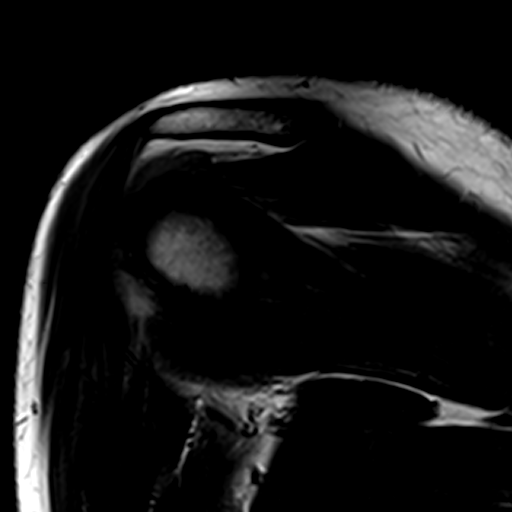
[im 20/20]
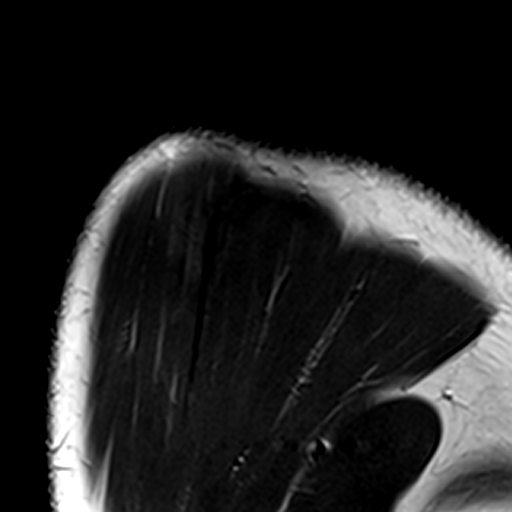

[Series 6: T1 · coronal · 3.0mm · 0.20mm/px · 6 of 24 slices shown]
[im 1/24]
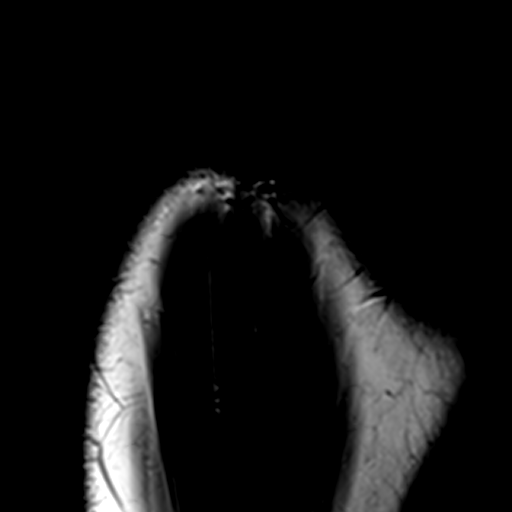
[im 3/24]
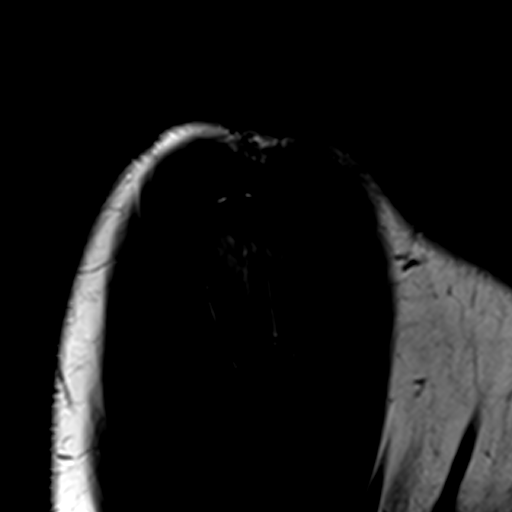
[im 6/24]
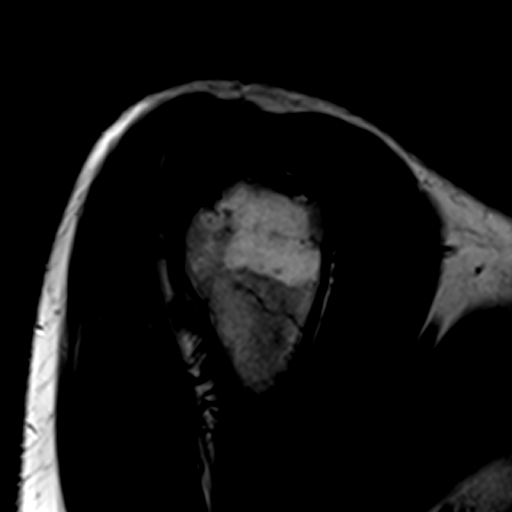
[im 9/24]
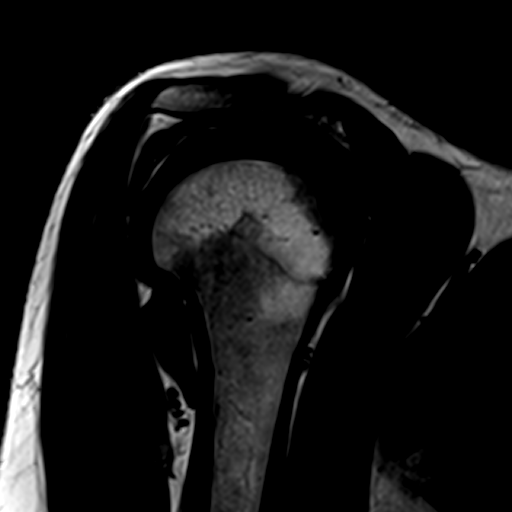
[im 12/24]
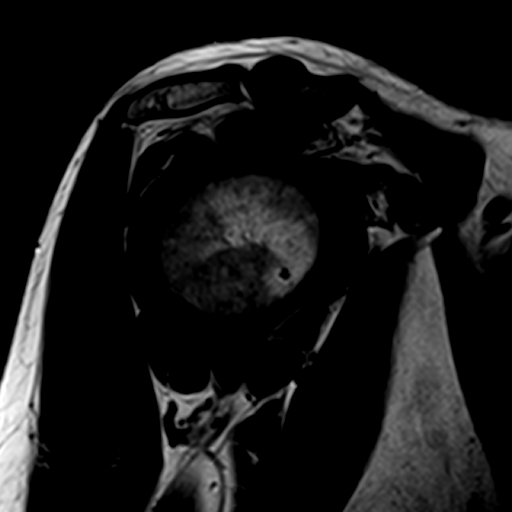
[im 21/24]
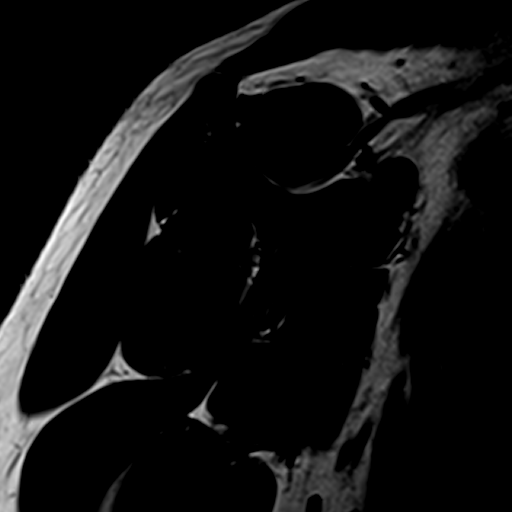

[19 of 40 positions shown; findings below may reference images not displayed]

FINDINGS: Rotator cuff: There is mild thickening and heterogeneously increased
T2 signal in the supraspinatus tendon consistent with tendinopathy.
Susceptibility artifact is seen about the supraspinatus consistent
with history of prior surgery. No rotator cuff tear is identified.

Muscles:  Normal in appearance without atrophy or focal lesion.

Biceps long head:  Intact and normal in appearance.

Acromioclavicular Joint:  Mild degenerative change is seen.

Glenohumeral Joint: Unremarkable.

Labrum:  Intact.

Bones: No fracture or worrisome marrow lesion. The acromion is type
1. A small amount of fluid is seen in the subacromial/subdeltoid
bursa.
IMPRESSION: Postoperative change of the supraspinatus tendon. Mild appearing
supraspinatus tendinopathy without tear is identified.

Mild acromioclavicular osteoarthritis.

Subacromial/subdeltoid fluid consistent with bursitis.

## 2017-07-09 ENCOUNTER — Encounter: Payer: Self-pay | Admitting: Gastroenterology

## 2017-08-22 ENCOUNTER — Ambulatory Visit: Payer: 59 | Admitting: Nurse Practitioner

## 2017-09-05 ENCOUNTER — Other Ambulatory Visit (HOSPITAL_COMMUNITY): Payer: Self-pay | Admitting: Orthopedic Surgery

## 2017-09-05 DIAGNOSIS — G8929 Other chronic pain: Secondary | ICD-10-CM

## 2017-09-05 DIAGNOSIS — M25512 Pain in left shoulder: Principal | ICD-10-CM

## 2017-09-07 ENCOUNTER — Ambulatory Visit (HOSPITAL_COMMUNITY): Payer: Managed Care, Other (non HMO)

## 2017-09-07 ENCOUNTER — Encounter (HOSPITAL_COMMUNITY): Payer: Self-pay

## 2017-09-13 ENCOUNTER — Ambulatory Visit (HOSPITAL_COMMUNITY)
Admission: RE | Admit: 2017-09-13 | Discharge: 2017-09-13 | Disposition: A | Discharge status: Home / Self Care | Payer: Managed Care, Other (non HMO) | Source: Ambulatory Visit | Attending: Orthopedic Surgery | Admitting: Orthopedic Surgery

## 2017-09-13 DIAGNOSIS — G8929 Other chronic pain: Secondary | ICD-10-CM

## 2017-09-13 DIAGNOSIS — M25512 Pain in left shoulder: Secondary | ICD-10-CM | POA: Insufficient documentation

## 2017-10-25 ENCOUNTER — Ambulatory Visit: Payer: Managed Care, Other (non HMO) | Admitting: Nurse Practitioner

## 2018-05-27 ENCOUNTER — Telehealth: Payer: Self-pay | Admitting: Obstetrics & Gynecology

## 2018-05-27 NOTE — Telephone Encounter (Signed)
Paper records

## 2018-05-27 NOTE — Telephone Encounter (Signed)
[facility] referring for Fibroids. Called and left voicemail for patient to call back to be schedule

## 2018-05-30 NOTE — Telephone Encounter (Signed)
Called and left voice mail for patient to call back to be schedule °

## 2018-06-03 NOTE — Telephone Encounter (Signed)
Called and left voicemail for patient to call back to be schedule. Sending letter by mail and contacting PCP

## 2018-06-13 ENCOUNTER — Encounter: Payer: Self-pay | Admitting: Obstetrics and Gynecology

## 2018-06-14 ENCOUNTER — Ambulatory Visit (INDEPENDENT_AMBULATORY_CARE_PROVIDER_SITE_OTHER): Payer: Managed Care, Other (non HMO) | Admitting: Obstetrics and Gynecology

## 2018-06-21 ENCOUNTER — Ambulatory Visit (INDEPENDENT_AMBULATORY_CARE_PROVIDER_SITE_OTHER): Payer: Managed Care, Other (non HMO) | Admitting: Obstetrics and Gynecology

## 2018-06-21 ENCOUNTER — Encounter: Payer: Self-pay | Admitting: Obstetrics and Gynecology

## 2018-06-21 VITALS — BP 130/80 | HR 88 | Ht 63.0 in | Wt 131.0 lb

## 2018-06-21 DIAGNOSIS — N3946 Mixed incontinence: Secondary | ICD-10-CM

## 2018-06-21 DIAGNOSIS — D250 Submucous leiomyoma of uterus: Secondary | ICD-10-CM

## 2018-06-21 DIAGNOSIS — N921 Excessive and frequent menstruation with irregular cycle: Secondary | ICD-10-CM | POA: Diagnosis not present

## 2018-06-21 NOTE — Progress Notes (Signed)
Patient ID: Brenda Cherry, female   DOB: 1981-05-27, 37 y.o.   MRN: [id number]  Reason for Consult: Fibroids (referred by [facility], pt states since sept she has 2 periods every month)   Referred by Lanelle Bal, PA-C  Subjective:     HPI:  Brenda Cherry is a 37 y.o. female. She presents today as a referral for a pelvic US which showed a possible submucosal fibroid. She has been having increasingly frequent periods with heavier than usual menstrual bleeding.   She also has significant urinary concerns. She reports both increase urinary frequency as well as issues with stress incontinence.   History reviewed. No pertinent past medical history. Family History  Problem Relation Age of Onset  . Cancer Maternal Aunt        ovarian   Past Surgical History:  Procedure Laterality Date  . APPENDECTOMY  1991    Short Social History:  Social History   Tobacco Use  . Smoking status: Current Some Day Smoker  . Smokeless tobacco: Never Used  Substance Use Topics  . Alcohol use: Yes    Comment: occ    Allergies  Allergen Reactions  . Sulfa Antibiotics     Current Outpatient Medications  Medication Sig Dispense Refill  . HYDROcodone-acetaminophen (NORCO) 10-325 MG per tablet Take 1 tablet by mouth every 4 (four) hours as needed.    Marland Kitchen ibuprofen (ADVIL,MOTRIN) 200 MG tablet Take by mouth.    Marland Kitchen omeprazole (PRILOSEC) 20 MG capsule Take 20 mg by mouth daily.     No current facility-administered medications for this visit.     Review of Systems  Constitutional: Negative for chills, fatigue, fever and unexpected weight change.  HENT: Negative for trouble swallowing.  Eyes: Negative for loss of vision.  Respiratory: Negative for cough, shortness of breath and wheezing.  Cardiovascular: Negative for chest pain, leg swelling, palpitations and syncope.  GI: Negative for abdominal pain, blood in stool, diarrhea, nausea and vomiting.  GU: Positive for frequency. Negative  for difficulty urinating, dysuria and hematuria.  Musculoskeletal: Negative for back pain, leg pain and joint pain.  Skin: Negative for rash.  Neurological: Negative for dizziness, headaches, light-headedness, numbness and seizures.  Psychiatric: Negative for behavioral problem, confusion, depressed mood and sleep disturbance.        Objective:  Objective   Vitals:   06/21/18 1442  BP: 130/80  Pulse: 88  Weight: 131 lb (59.4 kg)  Height: 5\' 3"  (1.6 m)   Body mass index is 23.21 kg/m.  Physical Exam Vitals signs and nursing note reviewed.  Constitutional:      Appearance: She is well-developed.  HENT:     Head: Normocephalic and atraumatic.  Eyes:     Pupils: Pupils are equal, round, and reactive to light.  Cardiovascular:     Rate and Rhythm: Normal rate and regular rhythm.  Pulmonary:     Effort: Pulmonary effort is normal. No respiratory distress.  Abdominal:     General: Abdomen is flat.     Palpations: Abdomen is soft.  Skin:    General: Skin is warm and dry.  Neurological:     Mental Status: She is alert and oriented to person, place, and time.  Psychiatric:        Behavior: Behavior normal.        Thought Content: Thought content normal.        Judgment: Judgment normal.         Assessment/Plan:  37 yo with menorrhagia and urinary frequency and stress incontinence  Possible submucosal fibroid- will schedule SIS to evaluate and for surgical planning.  Discussed IUD vs OCP as possible treatment. Referred to [email or web address] and given Mirena brochure.  Discussed sampling of the endometrium either with EMB or D&C in the future.   Urinary frequency and stress incontinence- discussed bladder sling. Discussed bladder diary to evaluate her urinary symptoms. Provided with a diary template and a hat to measure her urinary output.  Will follow up in 2 weeks for SIS.  More than 45 minutes were spent face to face with the patient in the room with more than 50% of  the time spent providing counseling and discussing the plan of management.    Adrian Prows MD [facility], [facility] 06/21/2018 3:50 PM

## 2018-06-25 ENCOUNTER — Encounter: Payer: Self-pay | Admitting: Obstetrics and Gynecology

## 2018-07-04 ENCOUNTER — Other Ambulatory Visit: Payer: Managed Care, Other (non HMO)

## 2018-07-04 ENCOUNTER — Ambulatory Visit: Payer: Managed Care, Other (non HMO) | Admitting: Obstetrics and Gynecology

## 2020-10-17 NOTE — Progress Notes (Signed)
Appointment cancelled

## 2022-05-01 ENCOUNTER — Emergency Department
Admission: EM | Admit: 2022-05-01 | Discharge: 2022-05-01 | Disposition: A | Discharge status: Home / Self Care | Payer: BC Managed Care – PPO | Attending: Emergency Medicine | Admitting: Emergency Medicine

## 2022-05-01 ENCOUNTER — Other Ambulatory Visit: Payer: Self-pay

## 2022-05-01 DIAGNOSIS — S61213A Laceration without foreign body of left middle finger without damage to nail, initial encounter: Secondary | ICD-10-CM | POA: Diagnosis not present

## 2022-05-01 DIAGNOSIS — S6992XA Unspecified injury of left wrist, hand and finger(s), initial encounter: Secondary | ICD-10-CM | POA: Diagnosis present

## 2022-05-01 DIAGNOSIS — W260XXA Contact with knife, initial encounter: Secondary | ICD-10-CM | POA: Insufficient documentation

## 2022-05-01 DIAGNOSIS — S61012A Laceration without foreign body of left thumb without damage to nail, initial encounter: Secondary | ICD-10-CM | POA: Diagnosis not present

## 2022-05-01 DIAGNOSIS — S61313A Laceration without foreign body of left middle finger with damage to nail, initial encounter: Secondary | ICD-10-CM

## 2022-05-01 MED ORDER — LIDOCAINE HCL (PF) 1 % IJ SOLN
10.0000 mL | Freq: Once | INTRAMUSCULAR | Status: DC
  Filled 2022-05-01: qty 10

## 2022-05-01 NOTE — Discharge Instructions (Signed)
Keep the wound clean and dry until a scab has formed. Also, wear the splint at all times until scab has formed and pain has decreased.  Do not apply antibiotic ointment or lotion to the area. Those will cause the surgical sealant to wear away faster.

## 2022-05-01 NOTE — ED Provider Notes (Signed)
[facility] Provider Note    Event Date/Time   First MD Initiated Contact with Patient 05/01/22 1323     (approximate)   History   Laceration   HPI  Brenda Cherry is a 41 y.o. female with no significant past medical history presents to the emergency department for treatment and evaluation of laceration to the left middle finger and left thumb.  She states that she was cutting cooked pork and the knife slipped.  Bleeding controlled with a pressure bandage.  Tdap is current.      Physical Exam   Triage Vital Signs: ED Triage Vitals  Enc Vitals Group     BP 05/01/22 1254 (!) 165/100     Pulse Rate 05/01/22 1254 86     Resp 05/01/22 1254 18     Temp 05/01/22 1254 98.6 F (37 C)     Temp Source 05/01/22 1254 Oral     SpO2 05/01/22 1254 95 %     Weight 05/01/22 1255 130 lb (59 kg)     Height 05/01/22 1255 '5\' 3"'$  (1.6 m)     Head Circumference --      Peak Flow --      Pain Score 05/01/22 1309 0     Pain Loc --      Pain Edu? --      Excl. in [place]? --     Most recent vital signs: Vitals:   05/01/22 1254 05/01/22 1544  BP: (!) 165/100 126/82  Pulse: 86 77  Resp: 18 18  Temp: 98.6 F (37 C) 98.5 F (36.9 C)  SpO2: 95% 97%     General: Awake, no distress.  CV:  Good peripheral perfusion.  Resp:  Normal effort.  Abd:  No distention.  Other:  Skin avulsion to the left middle finger involving the fingertip diagonally crossing the edge of the nail.  No bony exposure. Cut flap of skin is not well perfused.  Laceration to the left thumb is less than half centimeter and superficial with no active bleeding.  ED Results / Procedures / Treatments   Labs (all labs ordered are listed, but only abnormal results are displayed) Labs Reviewed - No data to display   EKG     RADIOLOGY     PROCEDURES:  Critical Care performed: No  ..Laceration Repair  Date/Time: 05/01/2022 6:52 PM  Performed by: Victorino Dike, FNP Authorized by:  Victorino Dike, FNP   Consent:    Consent obtained:  Verbal   Consent given by:  Patient   Risks discussed:  Pain and poor cosmetic result Universal protocol:    Procedure explained and questions answered to patient or proxy's satisfaction: yes     Patient identity confirmed:  Verbally with patient Anesthesia:    Anesthesia method:  Nerve block   Block needle gauge:  25 G   Block anesthetic:  Lidocaine 1% w/o epi   Block technique:  Digital block   Block injection procedure:  Anatomic landmarks identified Laceration details:    Location:  Finger   Finger location:  L long finger   Length (cm):  1 Pre-procedure details:    Preparation:  Patient was prepped and draped in usual sterile fashion Exploration:    Hemostasis achieved with:  Tourniquet (Tourni-Cot)   Wound extent: no tendon damage   Treatment:    Area cleansed with:  Povidone-iodine and saline   Irrigation method: Soak. Skin repair:    Repair method:  Tissue  adhesive Repair type:    Repair type:  Intermediate Post-procedure details:    Dressing:  Sterile dressing and splint for protection   Procedure completion:  Tolerated well, no immediate complications Comments:     Skin flap not viable, removed via sharp excision    MEDICATIONS ORDERED IN ED: Medications  lidocaine (PF) (XYLOCAINE) 1 % injection 10 mL (has no administration in time range)     IMPRESSION / MDM / ASSESSMENT AND PLAN / ED COURSE  I reviewed the triage vital signs and the nursing notes.  Differential diagnosis includes, but is not limited to, skin laceration; nail bed laceration.   Patient's presentation is most consistent with acute, uncomplicated illness.  41 year old female presenting to the emergency department for treatment and evaluation after excellently cutting her left thumb and middle finger.  Skin flap through the edge of the nail and fingertip on the middle finger not viable.  Wound cleaned and repaired as above.  Wound  observed for about 30 minutes after application of the Dermabond over the exposed skin without return of bleeding. Wound to the thumb does not require repair.   Wound care was discussed with the patient.  She is to monitor the site closely for infection.  The ports that she was cutting was cooked and the lacerations were well cleaned.  I do not feel that she will require antibiotic.  She is to follow-up with her primary care provider for any concerns.     FINAL CLINICAL IMPRESSION(S) / ED DIAGNOSES   Final diagnoses:  Laceration of left thumb without foreign body without damage to nail, initial encounter  Laceration of left middle finger without foreign body with damage to nail, initial encounter     Rx / DC Orders   ED Discharge Orders     None        Note:  This document was prepared using Dragon voice recognition software and may include unintentional dictation errors.   Victorino Dike, FNP 05/01/22 1901    Lavonia Drafts, MD 05/03/22 [phone]

## 2022-05-01 NOTE — ED Triage Notes (Signed)
Pt to ED via POV from home. Pt reports laceration to left middle finger into nail bed and left thumb. Pt cut finger with knife. Middle finger bleeding through bandage.

## 2023-03-16 ENCOUNTER — Other Ambulatory Visit: Payer: Self-pay | Admitting: Family Medicine

## 2023-03-16 DIAGNOSIS — N63 Unspecified lump in unspecified breast: Secondary | ICD-10-CM

## 2023-03-29 ENCOUNTER — Ambulatory Visit
Admission: RE | Admit: 2023-03-29 | Discharge: 2023-03-29 | Disposition: A | Discharge status: Home / Self Care | Payer: Self-pay | Source: Ambulatory Visit | Attending: Family Medicine | Admitting: Family Medicine

## 2023-03-29 DIAGNOSIS — N63 Unspecified lump in unspecified breast: Secondary | ICD-10-CM
